# Patient Record
Sex: Male | Born: 1990 | Race: White | Hispanic: No | Marital: Single | Smoking: Current every day smoker
Health system: Southern US, Community
[De-identification: ages and names within clinical notes are randomized; demographics above are authoritative.]

## PROBLEM LIST (undated history)

## (undated) DIAGNOSIS — I10 Essential (primary) hypertension: Secondary | ICD-10-CM

---
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
## 2011-09-23 IMAGING — CR DG CHEST 2V
2 series · 2 of 2 positions shown · non-contrast
Comparison: 12/06/2003

CLINICAL DATA: Anterior chest pain, smoker

CHEST - 2 VIEW

[view not recorded (1 of 2)]
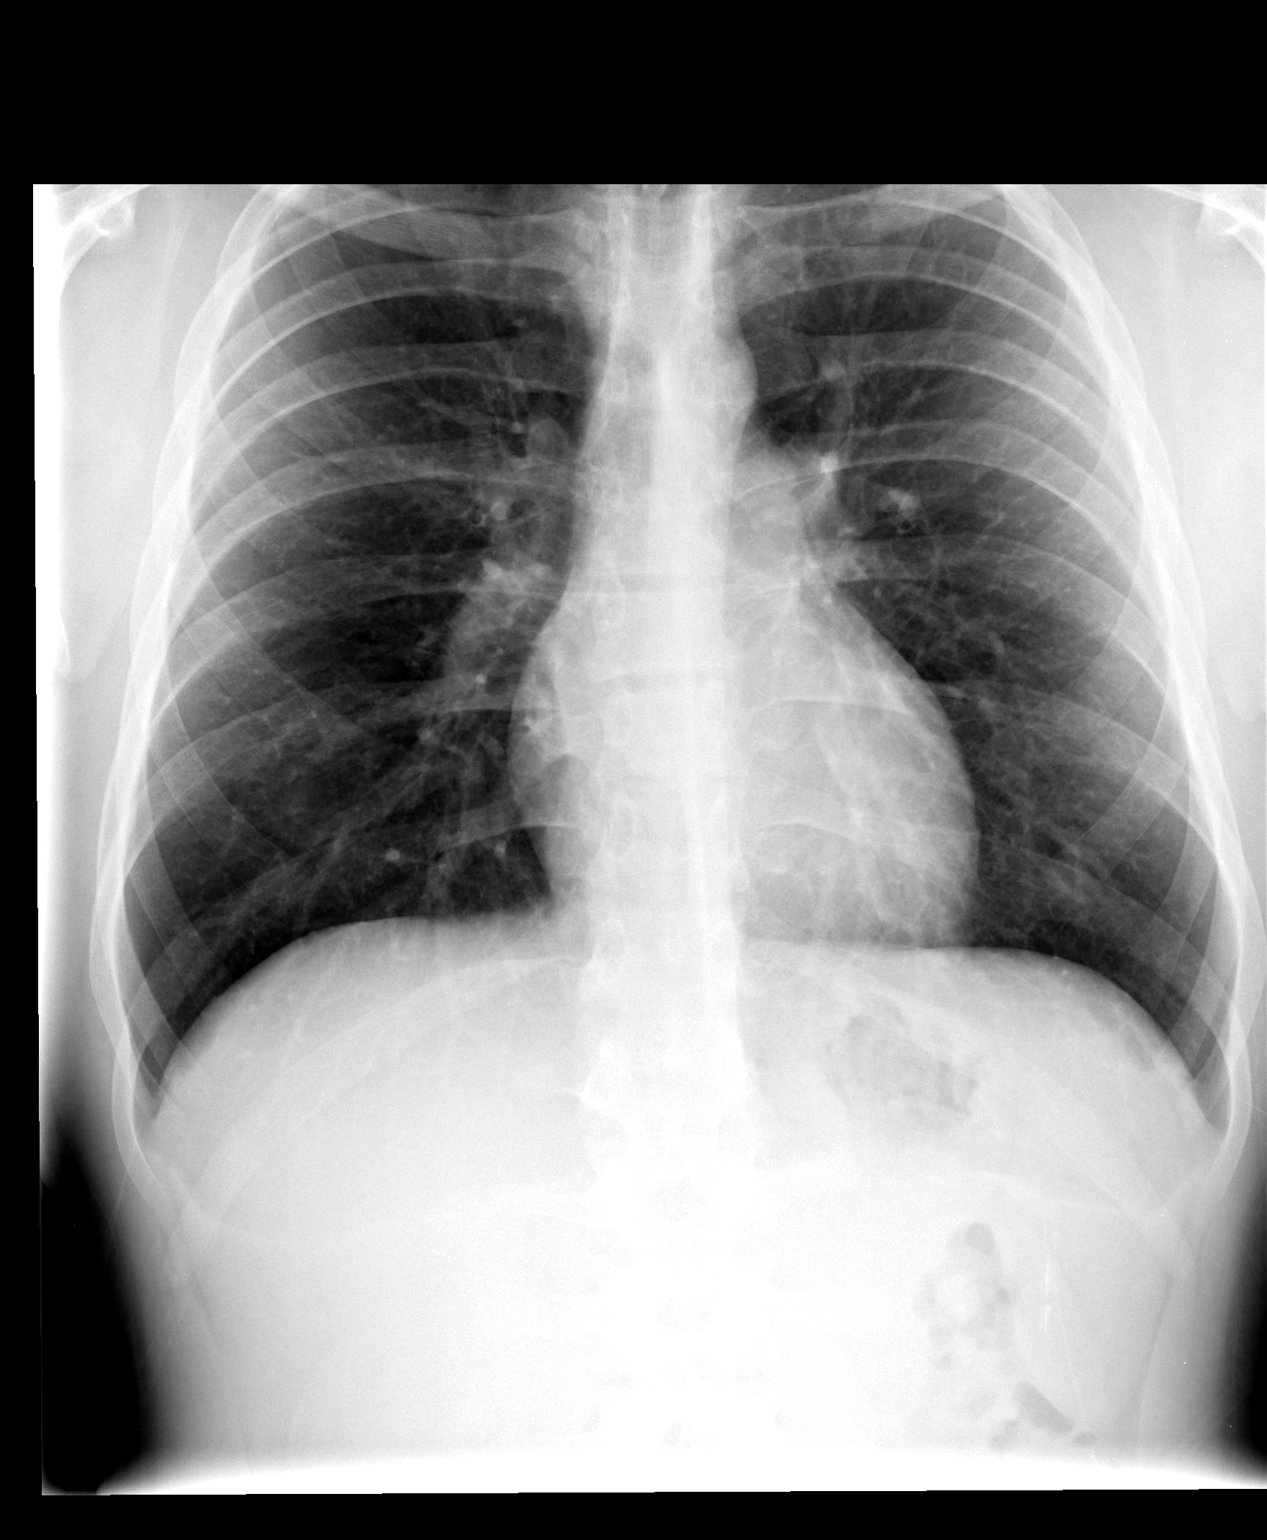

[view not recorded (2 of 2)]
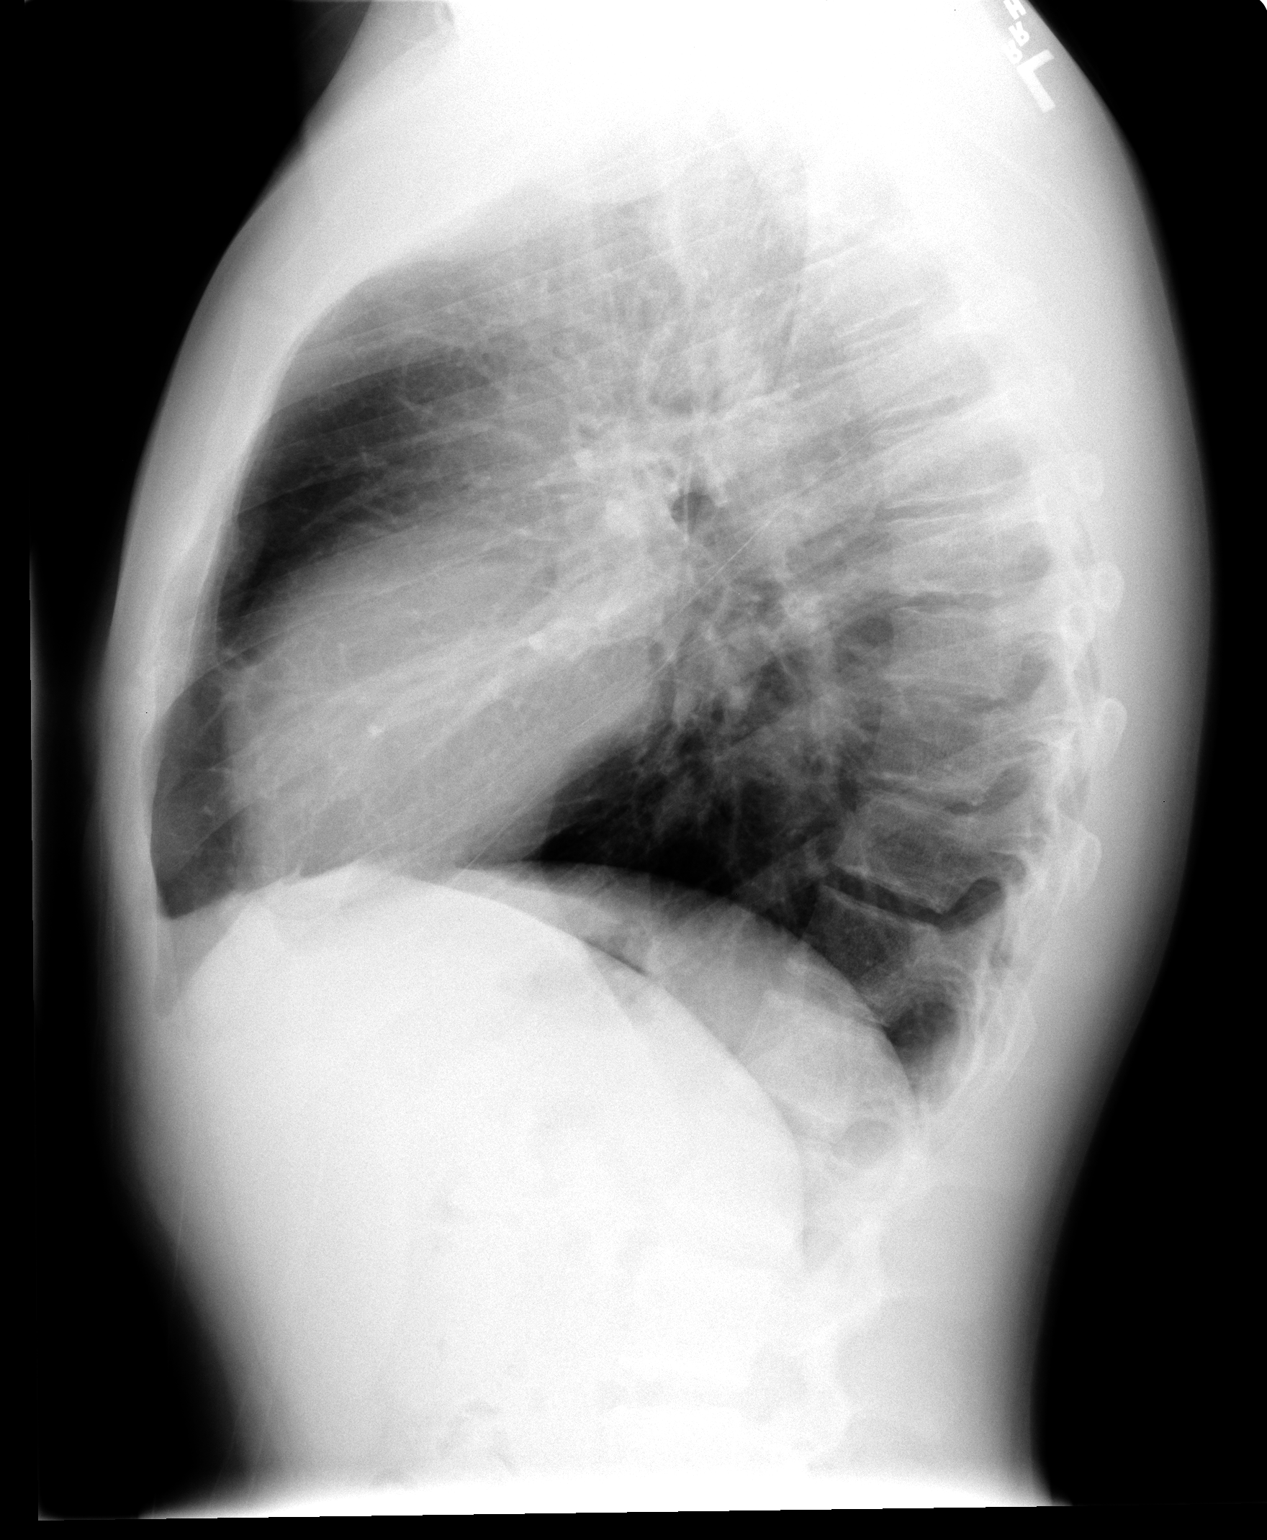

[2 of 2 positions shown; findings below may reference images not displayed]

FINDINGS: Normal heart size, mediastinal contours, and pulmonary vascularity.
Mild chronic bronchitic changes.
Lungs are hyperexpanded but otherwise clear.
No pleural effusion or pneumothorax.
No acute bony findings.
IMPRESSION: Chronic bronchitic changes and hyperexpanded lungs, which can be
related to asthma, smoking, or bronchitis.
No acute infiltrate.

## 2016-03-09 ENCOUNTER — Emergency Department (HOSPITAL_COMMUNITY)
Admission: EM | Admit: 2016-03-09 | Discharge: 2016-03-09 | Disposition: A | Discharge status: Home / Self Care | Payer: Self-pay | Attending: Emergency Medicine | Admitting: Emergency Medicine

## 2016-03-09 ENCOUNTER — Encounter (HOSPITAL_COMMUNITY): Payer: Self-pay

## 2016-03-09 DIAGNOSIS — F17210 Nicotine dependence, cigarettes, uncomplicated: Secondary | ICD-10-CM | POA: Insufficient documentation

## 2016-03-09 DIAGNOSIS — K029 Dental caries, unspecified: Secondary | ICD-10-CM | POA: Insufficient documentation

## 2016-03-09 DIAGNOSIS — R590 Localized enlarged lymph nodes: Secondary | ICD-10-CM | POA: Insufficient documentation

## 2016-03-09 DIAGNOSIS — Z79899 Other long term (current) drug therapy: Secondary | ICD-10-CM | POA: Insufficient documentation

## 2016-03-09 DIAGNOSIS — Z791 Long term (current) use of non-steroidal anti-inflammatories (NSAID): Secondary | ICD-10-CM | POA: Insufficient documentation

## 2016-03-09 LAB — RAPID STREP SCREEN (MED CTR MEBANE ONLY): STREPTOCOCCUS, GROUP A SCREEN (DIRECT): NEGATIVE

## 2016-03-09 MED ORDER — CLINDAMYCIN HCL 150 MG PO CAPS
300.0000 mg | ORAL_CAPSULE | Freq: Once | ORAL | Status: AC
  Administered 2016-03-09: 300 mg via ORAL
  Filled 2016-03-09: qty 2

## 2016-03-09 MED ORDER — CLINDAMYCIN HCL 150 MG PO CAPS: 300.0000 mg | ORAL_CAPSULE | Freq: Three times a day (TID) | ORAL | 0 refills | Status: DC

## 2016-03-09 NOTE — ED Triage Notes (Signed)
I have been waking up with a sore throat for the past couple of days and thought it was my sinuses.  Then today I had a lymph node that started swelling and my tongue became sore, then my throat was hurting on the left side and it was hard to swallow.

## 2016-03-09 NOTE — ED Provider Notes (Signed)
[facility] Provider Note   CSN: [id number] Arrival date & time: 03/09/16  2028     History   Chief Complaint Chief Complaint  Patient presents with  . Sore Throat    HPI Caleb Patterson is a 25 y.o. male who presents to the ED for swollen lymph gland on the left. Patient reports that over the past few days when he wakes in the mornings he has a sore throat but it resolves in the few hours. He thinks that is from post nasal drainage. Today he noted pain to the left side of his neck and a swollen lymph node. This has felt different than what he experienced the past few days. He does have a decayed tooth on the left lower.   The history is provided by the patient. No language interpreter was used.  Sore Throat     History reviewed. No pertinent past medical history.  Patient Active Problem List   Diagnosis Date Noted  . CLOSED FRACTURE OF ONE OR MORE PHALANGES OF FOOT 11/15/2008    History reviewed. No pertinent surgical history.     Home Medications    Prior to Admission medications   Medication Sig Start Date End Date Taking? Authorizing Provider  calcium carbonate (TUMS EX) 750 MG chewable tablet Chew 1 tablet by mouth daily as needed for heartburn.   Yes Historical Provider, MD  ibuprofen (ADVIL,MOTRIN) 200 MG tablet Take 200 mg by mouth every 6 (six) hours as needed for mild pain or moderate pain.   Yes Historical Provider, MD  pseudoephedrine (NASAL DECONGESTANT) 30 MG tablet Take 30 mg by mouth every 4 (four) hours as needed for congestion.   Yes Historical Provider, MD  clindamycin (CLEOCIN) 150 MG capsule Take 2 capsules (300 mg total) by mouth 3 (three) times daily. 03/09/16   Hope Orlene OchM Neese, NP    Family History No family history on file.  Social History Social History  Substance Use Topics  . Smoking status: Current Every Day Smoker    Packs/day: 2.00    Types: Cigarettes  . Smokeless tobacco: Never Used  . Alcohol use No     Allergies     Cefaclor; Cefprozil; and Sulfonamide derivatives   Review of Systems Review of Systems  Constitutional: Negative for fever.  HENT: Positive for sore throat. Dental problem: decayed tooth.   Hematological: Positive for adenopathy.  all other systems negative   Physical Exam Updated Vital Signs BP (!) 159/104 (BP Location: Right Arm)   Pulse 78   Temp 99 F (37.2 C) (Oral)   Resp 18   Ht 6' (1.829 m)   Wt 88.5 kg   SpO2 100%   BMI 26.45 kg/m   Physical Exam  Constitutional: He is oriented to person, place, and time. He appears well-developed and well-nourished. No distress.  HENT:  Head: Normocephalic.  Right Ear: Tympanic membrane normal.  Left Ear: Tympanic membrane normal.  Nose: Nose normal.  Mouth/Throat: Uvula is midline and mucous membranes are normal. Dental caries present. Posterior oropharyngeal erythema present.    Eyes: EOM are normal.  Neck: Neck supple.  Cardiovascular: Normal rate.   Pulmonary/Chest: Effort normal.  Musculoskeletal: Normal range of motion.  Lymphadenopathy:    He has cervical adenopathy.  Neurological: He is alert and oriented to person, place, and time. No cranial nerve deficit.  Skin: Skin is warm and dry.  Psychiatric: He has a normal mood and affect. His behavior is normal.  Nursing note and vitals reviewed.  ED Treatments / Results  Labs (all labs ordered are listed, but only abnormal results are displayed) Labs Reviewed  RAPID STREP SCREEN (NOT AT [facility])  CULTURE, GROUP A STREP [facility]   [facility] No results found.  Procedures Procedures (including critical care time)  Medications Ordered in ED Medications  clindamycin (CLEOCIN) capsule 300 mg (300 mg Oral Given 03/09/16 2246)     Initial Impression / Assessment and Plan / ED Course  I have reviewed the triage vital signs and the nursing notes.  Clinical Course   Discussed with the patient clinical findings and plan of care and all questions answered.    Also discussed with the patient elevated BP and need for f/u and he agrees with plan.   Final Clinical Impressions(s) / ED Diagnoses  25 y.o. male with decayed tooth left lower and cervical lymph node enlarged left stable for d/c without fever and does not appear toxic. Will start antibiotics and he will see a dentist as soon as possible.   Final diagnoses:  Dental caries  Cervical lymphadenopathy    New Prescriptions Discharge Medication List as of 03/09/2016 10:43 PM    START taking these medications   Details  clindamycin (CLEOCIN) 150 MG capsule Take 2 capsules (300 mg total) by mouth 3 (three) times daily., Starting Fri 03/09/2016, Print         Rock HillHope M Neese, NP 03/10/16 [id number]    Margarita Grizzleanielle Ray, MD 03/10/16 [phone]

## 2016-03-09 NOTE — ED Notes (Signed)
Pt alert & oriented x4, stable gait. Patient  given discharge instructions, paperwork & prescription(s). Patient verbalized understanding. Pt left department w/ no further questions. 

## 2016-03-09 NOTE — ED Notes (Signed)
Pt states thought he had a sinus drainage for the past 2 days. Says tonight the left side has become more sore to the point it hurts to move his tongue.

## 2016-03-13 LAB — CULTURE, GROUP A STREP (THRC)

## 2019-03-16 ENCOUNTER — Other Ambulatory Visit: Payer: Self-pay

## 2019-03-16 DIAGNOSIS — Z20822 Contact with and (suspected) exposure to covid-19: Secondary | ICD-10-CM

## 2019-03-17 ENCOUNTER — Telehealth: Payer: Self-pay

## 2019-03-17 LAB — NOVEL CORONAVIRUS, NAA: SARS-CoV-2, NAA: NOT DETECTED

## 2019-03-17 NOTE — Telephone Encounter (Signed)
Patient called for his COVID-19 test result. He was told his test was negative He was not infected with the Novel Coronavirus. He state he was tested because his Dian Queen was positive and they share a son between them. He was advised that he should continue to monitor for symptoms for 14 day post exposure. It was suggested that he notify the Health department for advice as well. Patient verbalized understanding.

## 2021-05-13 ENCOUNTER — Encounter: Payer: Self-pay | Admitting: Emergency Medicine

## 2021-05-13 ENCOUNTER — Other Ambulatory Visit: Payer: Self-pay

## 2021-05-13 ENCOUNTER — Ambulatory Visit
Admission: EM | Admit: 2021-05-13 | Discharge: 2021-05-13 | Disposition: A | Discharge status: Home / Self Care | Payer: Self-pay | Attending: Family Medicine | Admitting: Family Medicine

## 2021-05-13 DIAGNOSIS — J069 Acute upper respiratory infection, unspecified: Secondary | ICD-10-CM

## 2021-05-13 DIAGNOSIS — R062 Wheezing: Secondary | ICD-10-CM

## 2021-05-13 HISTORY — DX: Essential (primary) hypertension: I10

## 2021-05-13 MED ORDER — PREDNISONE 20 MG PO TABS: 40.0000 mg | ORAL_TABLET | Freq: Every day | ORAL | 0 refills | Status: DC

## 2021-05-13 NOTE — ED Triage Notes (Signed)
Patient c/o sinus pressure and productive cough x 3 days.   Patient denies fever.   Patient endorses SOB when coughing.   Patient endorses taking 2 COVID test with negative results.   Patient has taken Mucinex DM with no relief of symptoms.   Patient has used nebulizer at home.

## 2021-05-14 ENCOUNTER — Telehealth: Payer: Self-pay | Admitting: Emergency Medicine

## 2021-05-14 ENCOUNTER — Encounter: Payer: Self-pay | Admitting: Emergency Medicine

## 2021-05-14 MED ORDER — PREDNISONE 20 MG PO TABS: 40.0000 mg | ORAL_TABLET | Freq: Every day | ORAL | 0 refills | Status: DC

## 2021-05-15 NOTE — ED Provider Notes (Signed)
  [facility]   [id number] 05/13/21 Arrival Time: 1126  ASSESSMENT & PLAN:  1. Viral URI with cough   2. Wheezing    Discussed typical duration of viral illnesses. COVID-19 testing sent. OTC symptom care as needed. Work note provided.  Begin: Meds ordered this encounter  Medications   predniSONE (DELTASONE) 20 MG tablet    Sig: Take 2 tablets (40 mg total) by mouth daily.    Dispense:  10 tablet    Refill:  0     Follow-up Information     [facility].   Specialty: Urgent Care Why: If worsening or failing to improve as anticipated. Contact information: [address] [place] [address] [phone]                Reviewed expectations re: course of current medical issues. Questions answered. Outlined signs and symptoms indicating need for more acute intervention. Understanding verbalized. After Visit Summary given.   SUBJECTIVE: History from: patient. Caleb Patterson is a 30 y.o. male who reports: sinus congestion, coughing, wheezing; past 2-3 d; abrupt onset. Home COVID test neg x 2. Denies: difficulty breathing. Normal PO intake without n/v/d.   OBJECTIVE:  Vitals:   05/13/21 1230  BP: (!) 158/99  Pulse: 80  Resp: 15  Temp: 98.7 F (37.1 C)  TempSrc: Oral  SpO2: 98%    General appearance: alert; no distress Eyes: PERRLA; EOMI; conjunctiva normal HENT: [place]; AT; with nasal congestion Neck: supple  Lungs: speaks full sentences without difficulty; unlabored; bilateral exp wheezing Extremities: no edema Skin: warm and dry Neurologic: normal gait Psychological: alert and cooperative; normal mood and affect   Allergies  Allergen Reactions   Cefaclor Anaphylaxis   Cefprozil Anaphylaxis   Sulfonamide Derivatives Swelling and Other (See Comments)    Burning sensation    Past Medical History:  Diagnosis Date   Hypertension    Social History   Socioeconomic History    Marital status: Single    Spouse name: Not on file   Number of children: Not on file   Years of education: Not on file   Highest education level: Not on file  Occupational History   Not on file  Tobacco Use   Smoking status: Every Day    Packs/day: 2.00    Types: Cigarettes   Smokeless tobacco: Never  Substance and Sexual Activity   Alcohol use: No   Drug use: Not on file   Sexual activity: Not on file  Other Topics Concern   Not on file  Social History Narrative   Not on file   Social Determinants of Health   Financial Resource Strain: Not on file  Food Insecurity: Not on file  Transportation Needs: Not on file  Physical Activity: Not on file  Stress: Not on file  Social Connections: Not on file  Intimate Partner Violence: Not on file   History reviewed. No pertinent family history. History reviewed. No pertinent surgical history.   Mardella Layman, MD 05/15/21 [phone]

## 2021-08-28 ENCOUNTER — Ambulatory Visit
Admission: EM | Admit: 2021-08-28 | Discharge: 2021-08-28 | Disposition: A | Discharge status: Home / Self Care | Payer: Self-pay | Attending: Family Medicine | Admitting: Family Medicine

## 2021-08-28 ENCOUNTER — Other Ambulatory Visit: Payer: Self-pay

## 2021-08-28 DIAGNOSIS — J029 Acute pharyngitis, unspecified: Secondary | ICD-10-CM | POA: Insufficient documentation

## 2021-08-28 DIAGNOSIS — R509 Fever, unspecified: Secondary | ICD-10-CM | POA: Insufficient documentation

## 2021-08-28 LAB — POCT RAPID STREP A (OFFICE): Rapid Strep A Screen: NEGATIVE

## 2021-08-28 MED ORDER — LIDOCAINE VISCOUS HCL 2 % MT SOLN: 10.0000 mL | OROMUCOSAL | 0 refills | Status: DC | PRN

## 2021-08-28 MED ORDER — AZITHROMYCIN 250 MG PO TABS: ORAL_TABLET | ORAL | 0 refills | Status: DC

## 2021-08-28 NOTE — ED Triage Notes (Signed)
Pt presents with c/o sore throat and headache that began 2 days ago , concerned

## 2021-08-28 NOTE — ED Provider Notes (Signed)
[facility]    CSN: [id number] Arrival date & time: 08/28/21  1035      History   Chief Complaint Chief Complaint  Patient presents with   Sore Throat   Headache    HPI Caleb Patterson is a 31 y.o. male.   Presenting today with 2-day history of worsening sore, swollen throat with white spots, headache, swollen tender lymph nodes in the neck, postnasal drainage.  Denies chest pain, shortness of breath, cough, abdominal pain, nausea vomiting or diarrhea.  Taking over-the-counter throat lozenges, pain relievers with minimal relief.  No known pertinent chronic medical problems.   Past Medical History:  Diagnosis Date   Hypertension     Patient Active Problem List   Diagnosis Date Noted   CLOSED FRACTURE OF ONE OR MORE PHALANGES OF FOOT 11/15/2008    History reviewed. No pertinent surgical history.     Home Medications    Prior to Admission medications   Medication Sig Start Date End Date Taking? Authorizing Provider  azithromycin (ZITHROMAX) 250 MG tablet Take first 2 tablets together, then 1 every day until finished. 08/28/21  Yes Volney American, PA-C  lidocaine (XYLOCAINE) 2 % solution Use as directed 10 mLs in the mouth or throat every 3 (three) hours as needed for mouth pain. 08/28/21  Yes Volney American, PA-C  calcium carbonate (TUMS EX) 750 MG chewable tablet Chew 1 tablet by mouth daily as needed for heartburn.    [provider]  clindamycin (CLEOCIN) 150 MG capsule Take 2 capsules (300 mg total) by mouth 3 (three) times daily. 03/09/16   Ashley Murrain, NP  ibuprofen (ADVIL,MOTRIN) 200 MG tablet Take 200 mg by mouth every 6 (six) hours as needed for mild pain or moderate pain.    [provider]  predniSONE (DELTASONE) 20 MG tablet Take 2 tablets (40 mg total) by mouth daily. 05/14/21   Volney American, PA-C  pseudoephedrine (SUDAFED) 30 MG tablet Take 30 mg by mouth every 4 (four) hours as needed for congestion.     [provider]    Family History History reviewed. No pertinent family history.  Social History Social History   Tobacco Use   Smoking status: Every Day    Packs/day: 2.00    Types: Cigarettes   Smokeless tobacco: Never  Substance Use Topics   Alcohol use: No     Allergies   Cefaclor, Cefprozil, and Sulfonamide derivatives   Review of Systems Review of Systems Per HPI  Physical Exam Triage Vital Signs ED Triage Vitals  Enc Vitals Group     BP 08/28/21 1151 (!) 135/92     Pulse Rate 08/28/21 1151 95     Resp 08/28/21 1151 20     Temp 08/28/21 1151 99.1 F (37.3 C)     Temp src --      SpO2 08/28/21 1151 98 %     Weight --      Height --      Head Circumference --      Peak Flow --      Pain Score 08/28/21 1149 6     Pain Loc --      Pain Edu? --      Excl. in [place]? --    No data found.  Updated Vital Signs BP (!) 135/92    Pulse 95    Temp 99.1 F (37.3 C)    Resp 20    SpO2 98%   Visual Acuity  Right Eye Distance:   Left Eye Distance:   Bilateral Distance:    Right Eye Near:   Left Eye Near:    Bilateral Near:     Physical Exam Vitals and nursing note reviewed.  Constitutional:      Appearance: He is well-developed.  HENT:     Head: Atraumatic.     Right Ear: External ear normal.     Left Ear: External ear normal.     Nose: Nose normal.     Mouth/Throat:     Pharynx: Oropharyngeal exudate and posterior oropharyngeal erythema present.     Comments: Moderate bilateral tonsillar erythema, edema, exudates.  Uvula midline, other repeat Eyes:     Conjunctiva/sclera: Conjunctivae normal.     Pupils: Pupils are equal, round, and reactive to light.  Cardiovascular:     Rate and Rhythm: Normal rate and regular rhythm.  Pulmonary:     Effort: Pulmonary effort is normal. No respiratory distress.     Breath sounds: No wheezing or rales.  Musculoskeletal:        General: Normal range of motion.     Cervical back: Normal range of motion and  neck supple.  Lymphadenopathy:     Cervical: Cervical adenopathy present.  Skin:    General: Skin is warm and dry.  Neurological:     Mental Status: He is alert and oriented to person, place, and time.  Psychiatric:        Behavior: Behavior normal.     UC Treatments / Results  Labs (all labs ordered are listed, but only abnormal results are displayed) Labs Reviewed  CULTURE, GROUP A STREP [facility])  POCT RAPID STREP A (OFFICE)    EKG   Radiology No results found.  Procedures Procedures (including critical care time)  Medications Ordered in UC Medications - No data to display  Initial Impression / Assessment and Plan / UC Course  I have reviewed the triage vital signs and the nursing notes.  Pertinent labs & imaging results that were available during my care of the patient were reviewed by me and considered in my medical decision making (see chart for details).     Rapid strep negative, throat culture pending.  Given exam findings and severity of symptoms will cover for bacterial tonsillitis with azithromycin, viscous lidocaine in addition to over-the-counter supportive medications and home care.  Return for acutely worsening symptoms.  Declines viral testing today.  Final Clinical Impressions(s) / UC Diagnoses   Final diagnoses:  Sore throat  Fever, unspecified   Discharge Instructions   None    ED Prescriptions     Medication Sig Dispense Auth. Provider   azithromycin (ZITHROMAX) 250 MG tablet Take first 2 tablets together, then 1 every day until finished. 6 tablet Volney American, PA-C   lidocaine (XYLOCAINE) 2 % solution Use as directed 10 mLs in the mouth or throat every 3 (three) hours as needed for mouth pain. 100 mL Volney American, [place]      PDMP not reviewed this encounter.   Volney American, [place] 08/28/21 1242

## 2021-08-31 LAB — CULTURE, GROUP A STREP (THRC)

## 2022-12-25 ENCOUNTER — Ambulatory Visit
Admission: RE | Admit: 2022-12-25 | Discharge: 2022-12-25 | Disposition: A | Discharge status: Home / Self Care | Payer: Self-pay | Source: Ambulatory Visit | Attending: Nurse Practitioner | Admitting: Nurse Practitioner

## 2022-12-25 VITALS — BP 158/99 | HR 85 | Temp 98.4°F | Resp 20

## 2022-12-25 DIAGNOSIS — L247 Irritant contact dermatitis due to plants, except food: Secondary | ICD-10-CM

## 2022-12-25 MED ORDER — METHYLPREDNISOLONE ACETATE 40 MG/ML IJ SUSP
40.0000 mg | Freq: Once | INTRAMUSCULAR | Status: AC
  Administered 2022-12-25: 40 mg via INTRAMUSCULAR

## 2022-12-25 MED ORDER — PREDNISONE 10 MG PO TABS: ORAL_TABLET | ORAL | 0 refills | Status: AC

## 2022-12-25 NOTE — Discharge Instructions (Signed)
We have given you a steroid shot today to help with inflammation from the rash.  Start the oral prednisone taper tomorrow and take as prescribed.  Continue OTC anti itch remedies.

## 2022-12-25 NOTE — ED Triage Notes (Signed)
Itchy rash on arms and on face x 3 days.  States rash started in groin area.

## 2022-12-25 NOTE — ED Provider Notes (Signed)
[facility]    CSN: [id number] Arrival date & time: 12/25/22  1348      History   Chief Complaint No chief complaint on file.   HPI Caleb Patterson is a 32 y.o. male.   Patient presents today for rash.  Reports it started in his groin and has moved to bilateral upper arms, face, and neck.  Reports he was exposed to poison sumac approximately 4 days ago.  No recent change in soaps, detergents, personal care products.  No shortness of breath or throat/tongue swelling.  Reports the rash is raised, red, and itchy.  No surrounding erythema or active drainage/oozing.  Has used OTC anti itch creams/lotions, bleach without improvement in symptoms.    Past Medical History:  Diagnosis Date   Hypertension     Patient Active Problem List   Diagnosis Date Noted   CLOSED FRACTURE OF ONE OR MORE PHALANGES OF FOOT 11/15/2008    History reviewed. No pertinent surgical history.     Home Medications    Prior to Admission medications   Medication Sig Start Date End Date Taking? Authorizing Provider  predniSONE (DELTASONE) 10 MG tablet Take 6 tablets by mouth daily for 2 days, then reduce by 1 tablet every 2 days until gone 12/25/22  Yes Valentino Nose, NP  calcium carbonate (TUMS EX) 750 MG chewable tablet Chew 1 tablet by mouth daily as needed for heartburn.    [provider]  ibuprofen (ADVIL,MOTRIN) 200 MG tablet Take 200 mg by mouth every 6 (six) hours as needed for mild pain or moderate pain.    [provider]    Family History History reviewed. No pertinent family history.  Social History Social History   Tobacco Use   Smoking status: Every Day    Packs/day: 2    Types: Cigarettes   Smokeless tobacco: Never  Substance Use Topics   Alcohol use: No     Allergies   Cefaclor, Cefprozil, and Sulfonamide derivatives   Review of Systems Review of Systems Per HPI  Physical Exam Triage Vital Signs ED Triage Vitals  Enc Vitals Group      BP 12/25/22 1354 (!) 158/99     Pulse Rate 12/25/22 1354 85     Resp 12/25/22 1354 20     Temp 12/25/22 1354 98.4 F (36.9 C)     Temp Source 12/25/22 1354 Oral     SpO2 12/25/22 1354 97 %     Weight --      Height --      Head Circumference --      Peak Flow --      Pain Score 12/25/22 1356 0     Pain Loc --      Pain Edu? --      Excl. in [place]? --    No data found.  Updated Vital Signs BP (!) 158/99 (BP Location: Right Arm)   Pulse 85   Temp 98.4 F (36.9 C) (Oral)   Resp 20   SpO2 97%   Visual Acuity Right Eye Distance:   Left Eye Distance:   Bilateral Distance:    Right Eye Near:   Left Eye Near:    Bilateral Near:     Physical Exam Vitals and nursing note reviewed.  Constitutional:      General: He is not in acute distress.    Appearance: Normal appearance. He is not toxic-appearing.  HENT:     Head: Normocephalic and atraumatic.  Mouth/Throat:     Mouth: Mucous membranes are moist.     Pharynx: Oropharynx is clear.  Pulmonary:     Effort: Pulmonary effort is normal. No respiratory distress.  Skin:    General: Skin is warm and dry.     Capillary Refill: Capillary refill takes less than 2 seconds.     Coloration: Skin is not jaundiced or pale.     Findings: Erythema and rash present. Rash is macular and papular.     Comments: Linear, slightly erythematous rash with fluid filled vesicles to bilateral wrists/forearms and face.  No active drainage, warmth, or odor.    Neurological:     Mental Status: He is alert and oriented to person, place, and time.  Psychiatric:        Behavior: Behavior is cooperative.      UC Treatments / Results  Labs (all labs ordered are listed, but only abnormal results are displayed) Labs Reviewed - No data to display  EKG   Radiology No results found.  Procedures Procedures (including critical care time)  Medications Ordered in UC Medications  methylPREDNISolone acetate (DEPO-MEDROL) injection 40 mg (40  mg Intramuscular Given 12/25/22 1410)    Initial Impression / Assessment and Plan / UC Course  I have reviewed the triage vital signs and the nursing notes.  Pertinent labs & imaging results that were available during my care of the patient were reviewed by me and considered in my medical decision making (see chart for details).   Patient is well-appearing, afebrile, not tachycardic, not tachypneic, oxygenating well on room air.  Patient is mildly hypertensive today in urgent care.  1. Irritant contact dermatitis due to plant Treat with Depo Medrol 40 mg IM today in urgent care for rash/inflammation Start oral prednisone tomorrow Continue OTC anti itch remedies Seek care for persistent or worsening symptoms despite treatment   The patient was given the opportunity to ask questions.  All questions answered to their satisfaction.  The patient is in agreement to this plan.    Final Clinical Impressions(s) / UC Diagnoses   Final diagnoses:  Irritant contact dermatitis due to plant     Discharge Instructions      We have given you a steroid shot today to help with inflammation from the rash.  Start the oral prednisone taper tomorrow and take as prescribed.  Continue OTC anti itch remedies.     ED Prescriptions     Medication Sig Dispense Auth. Provider   predniSONE (DELTASONE) 10 MG tablet Take 6 tablets by mouth daily for 2 days, then reduce by 1 tablet every 2 days until gone 42 tablet Valentino Nose, NP      PDMP not reviewed this encounter.   Valentino Nose, NP 12/25/22 1423
